# Patient Record
Sex: Female | Born: 1980 | Race: White | Hispanic: No | Marital: Married | Smoking: Never smoker
Health system: Southern US, Community
[De-identification: ages and names within clinical notes are randomized; demographics above are authoritative.]

## PROBLEM LIST (undated history)

## (undated) ENCOUNTER — Inpatient Hospital Stay (HOSPITAL_COMMUNITY): Payer: Self-pay

## (undated) DIAGNOSIS — Z789 Other specified health status: Secondary | ICD-10-CM

## (undated) DIAGNOSIS — O139 Gestational [pregnancy-induced] hypertension without significant proteinuria, unspecified trimester: Secondary | ICD-10-CM

---
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
## 2000-01-27 ENCOUNTER — Other Ambulatory Visit: Admission: RE | Admit: 2000-01-27 | Discharge: 2000-01-27 | Payer: Self-pay | Admitting: Obstetrics & Gynecology

## 2000-05-25 ENCOUNTER — Other Ambulatory Visit: Admission: RE | Admit: 2000-05-25 | Discharge: 2000-05-25 | Payer: Self-pay | Admitting: Obstetrics & Gynecology

## 2001-04-18 ENCOUNTER — Other Ambulatory Visit: Admission: RE | Admit: 2001-04-18 | Discharge: 2001-04-18 | Payer: Self-pay | Admitting: Obstetrics & Gynecology

## 2002-06-15 ENCOUNTER — Other Ambulatory Visit: Admission: RE | Admit: 2002-06-15 | Discharge: 2002-06-15 | Payer: Self-pay | Admitting: Obstetrics & Gynecology

## 2003-07-19 ENCOUNTER — Other Ambulatory Visit: Admission: RE | Admit: 2003-07-19 | Discharge: 2003-07-19 | Payer: Self-pay | Admitting: Obstetrics & Gynecology

## 2004-08-28 ENCOUNTER — Other Ambulatory Visit: Admission: RE | Admit: 2004-08-28 | Discharge: 2004-08-28 | Payer: Self-pay | Admitting: Obstetrics & Gynecology

## 2012-01-14 ENCOUNTER — Other Ambulatory Visit: Payer: Self-pay | Admitting: Obstetrics and Gynecology

## 2012-01-15 ENCOUNTER — Encounter (HOSPITAL_COMMUNITY): Payer: Self-pay | Admitting: Pharmacist

## 2012-01-18 ENCOUNTER — Encounter (HOSPITAL_COMMUNITY)
Admission: RE | Disposition: A | Discharge status: Home / Self Care | Payer: Self-pay | Source: Ambulatory Visit | Attending: Obstetrics and Gynecology

## 2012-01-18 ENCOUNTER — Encounter (HOSPITAL_COMMUNITY): Payer: Self-pay | Admitting: Anesthesiology

## 2012-01-18 ENCOUNTER — Encounter (HOSPITAL_COMMUNITY): Payer: Self-pay | Admitting: *Deleted

## 2012-01-18 ENCOUNTER — Ambulatory Visit (HOSPITAL_COMMUNITY)
Admission: RE | Admit: 2012-01-18 | Discharge: 2012-01-18 | Disposition: A | Discharge status: Home / Self Care | Payer: Medicaid Other | Source: Ambulatory Visit | Attending: Obstetrics and Gynecology | Admitting: Obstetrics and Gynecology

## 2012-01-18 ENCOUNTER — Ambulatory Visit (HOSPITAL_COMMUNITY): Payer: Medicaid Other | Admitting: Anesthesiology

## 2012-01-18 DIAGNOSIS — O021 Missed abortion: Secondary | ICD-10-CM | POA: Insufficient documentation

## 2012-01-18 HISTORY — DX: Other specified health status: Z78.9

## 2012-01-18 HISTORY — PX: DILATION AND EVACUATION: SHX1459

## 2012-01-18 LAB — CBC
MCH: 30.9 pg (ref 26.0–34.0)
MCHC: 34.4 g/dL (ref 30.0–36.0)
MCV: 90 fL (ref 78.0–100.0)
Platelets: 272 10*3/uL (ref 150–400)
RBC: 3.91 MIL/uL (ref 3.87–5.11)
RDW: 12.5 % (ref 11.5–15.5)

## 2012-01-18 SURGERY — DILATION AND EVACUATION, UTERUS
Anesthesia: Monitor Anesthesia Care | Site: Uterus | Wound class: Clean Contaminated

## 2012-01-18 MED ORDER — OXYCODONE-ACETAMINOPHEN 5-325 MG PO TABS
ORAL_TABLET | ORAL | Status: AC
  Filled 2012-01-18: qty 1

## 2012-01-18 MED ORDER — MIDAZOLAM HCL 2 MG/2ML IJ SOLN
INTRAMUSCULAR | Status: AC
  Filled 2012-01-18: qty 2

## 2012-01-18 MED ORDER — FENTANYL CITRATE 0.05 MG/ML IJ SOLN
INTRAMUSCULAR | Status: AC
  Filled 2012-01-18: qty 2

## 2012-01-18 MED ORDER — ONDANSETRON HCL 4 MG/2ML IJ SOLN: 4.0000 mg | Freq: Once | INTRAMUSCULAR | Status: DC | PRN

## 2012-01-18 MED ORDER — MIDAZOLAM HCL 5 MG/5ML IJ SOLN
INTRAMUSCULAR | Status: DC | PRN
  Administered 2012-01-18: 2 mg via INTRAVENOUS

## 2012-01-18 MED ORDER — MEPERIDINE HCL 25 MG/ML IJ SOLN: 6.2500 mg | INTRAMUSCULAR | Status: DC | PRN

## 2012-01-18 MED ORDER — DEXAMETHASONE SODIUM PHOSPHATE 10 MG/ML IJ SOLN
INTRAMUSCULAR | Status: AC
  Filled 2012-01-18: qty 1

## 2012-01-18 MED ORDER — GLYCOPYRROLATE 0.2 MG/ML IJ SOLN
INTRAMUSCULAR | Status: AC
Start: 2012-01-18 — End: 2012-01-18
  Filled 2012-01-18: qty 1

## 2012-01-18 MED ORDER — PROPOFOL 10 MG/ML IV EMUL
INTRAVENOUS | Status: DC | PRN
  Administered 2012-01-18: 40 mg via INTRAVENOUS

## 2012-01-18 MED ORDER — KETOROLAC TROMETHAMINE 30 MG/ML IJ SOLN: 15.0000 mg | Freq: Once | INTRAMUSCULAR | Status: DC | PRN

## 2012-01-18 MED ORDER — PROPOFOL 10 MG/ML IV EMUL
INTRAVENOUS | Status: AC
  Filled 2012-01-18: qty 40

## 2012-01-18 MED ORDER — KETOROLAC TROMETHAMINE 30 MG/ML IJ SOLN
INTRAMUSCULAR | Status: DC | PRN
  Administered 2012-01-18: 30 mg via INTRAVENOUS

## 2012-01-18 MED ORDER — DOXYCYCLINE HYCLATE 100 MG IV SOLR
100.0000 mg | Freq: Once | INTRAVENOUS | Status: AC
  Administered 2012-01-18: 100 mg via INTRAVENOUS
  Filled 2012-01-18: qty 100

## 2012-01-18 MED ORDER — ONDANSETRON HCL 4 MG/2ML IJ SOLN
INTRAMUSCULAR | Status: DC | PRN
  Administered 2012-01-18: 4 mg via INTRAVENOUS

## 2012-01-18 MED ORDER — ONDANSETRON HCL 4 MG/2ML IJ SOLN
INTRAMUSCULAR | Status: AC
  Filled 2012-01-18: qty 2

## 2012-01-18 MED ORDER — FENTANYL CITRATE 0.05 MG/ML IJ SOLN
INTRAMUSCULAR | Status: DC | PRN
  Administered 2012-01-18 (×4): 25 ug via INTRAVENOUS
  Administered 2012-01-18: 100 ug via INTRAVENOUS

## 2012-01-18 MED ORDER — LIDOCAINE HCL 1 % IJ SOLN
INTRAMUSCULAR | Status: DC | PRN
  Administered 2012-01-18: 10 mL

## 2012-01-18 MED ORDER — FENTANYL CITRATE 0.05 MG/ML IJ SOLN
INTRAMUSCULAR | Status: AC
  Administered 2012-01-18: 50 ug via INTRAVENOUS
  Filled 2012-01-18: qty 2

## 2012-01-18 MED ORDER — LACTATED RINGERS IV SOLN
INTRAVENOUS | Status: DC
  Administered 2012-01-18: 125 mL/h via INTRAVENOUS
  Administered 2012-01-18: 07:00:00 via INTRAVENOUS

## 2012-01-18 MED ORDER — OXYCODONE-ACETAMINOPHEN 5-325 MG PO TABS
1.0000 | ORAL_TABLET | ORAL | Status: DC | PRN
  Administered 2012-01-18: 1 via ORAL

## 2012-01-18 MED ORDER — LIDOCAINE HCL (CARDIAC) 20 MG/ML IV SOLN
INTRAVENOUS | Status: AC
  Filled 2012-01-18: qty 5

## 2012-01-18 MED ORDER — KETOROLAC TROMETHAMINE 60 MG/2ML IM SOLN
INTRAMUSCULAR | Status: DC | PRN
  Administered 2012-01-18: 30 mg via INTRAMUSCULAR

## 2012-01-18 MED ORDER — DEXAMETHASONE SODIUM PHOSPHATE 10 MG/ML IJ SOLN
INTRAMUSCULAR | Status: DC | PRN
  Administered 2012-01-18: 10 mg via INTRAVENOUS

## 2012-01-18 MED ORDER — FENTANYL CITRATE 0.05 MG/ML IJ SOLN
25.0000 ug | INTRAMUSCULAR | Status: DC | PRN
  Administered 2012-01-18 (×2): 50 ug via INTRAVENOUS

## 2012-01-18 MED ORDER — PROPOFOL 10 MG/ML IV EMUL
INTRAVENOUS | Status: DC | PRN
  Administered 2012-01-18: 120 ug/kg/min via INTRAVENOUS

## 2012-01-18 MED ORDER — LIDOCAINE HCL (CARDIAC) 20 MG/ML IV SOLN
INTRAVENOUS | Status: DC | PRN
  Administered 2012-01-18: 80 mg via INTRAVENOUS

## 2012-01-18 SURGICAL SUPPLY — 23 items
CATH ROBINSON RED A/P 16FR (CATHETERS) ×2 IMPLANT
CLOTH BEACON ORANGE TIMEOUT ST (SAFETY) ×2 IMPLANT
DECANTER SPIKE VIAL GLASS SM (MISCELLANEOUS) ×2 IMPLANT
GLOVE BIO SURGEON STRL SZ 6.5 (GLOVE) ×4 IMPLANT
GLOVE BIOGEL PI IND STRL 6.5 (GLOVE) ×2 IMPLANT
GLOVE BIOGEL PI IND STRL 7.0 (GLOVE) ×1 IMPLANT
GLOVE BIOGEL PI INDICATOR 6.5 (GLOVE) ×2
GLOVE BIOGEL PI INDICATOR 7.0 (GLOVE) ×1
GLOVE SURG SS PI 6.5 STRL IVOR (GLOVE) ×2 IMPLANT
GOWN PREVENTION PLUS LG XLONG (DISPOSABLE) ×4 IMPLANT
KIT BERKELEY 1ST TRIMESTER 3/8 (MISCELLANEOUS) ×2 IMPLANT
NEEDLE SPNL 22GX3.5 QUINCKE BK (NEEDLE) ×2 IMPLANT
NS IRRIG 1000ML POUR BTL (IV SOLUTION) ×2 IMPLANT
PACK VAGINAL MINOR WOMEN LF (CUSTOM PROCEDURE TRAY) ×2 IMPLANT
PAD OB MATERNITY 4.3X12.25 (PERSONAL CARE ITEMS) ×2 IMPLANT
PAD PREP 24X48 CUFFED NSTRL (MISCELLANEOUS) ×2 IMPLANT
SET BERKELEY SUCTION TUBING (SUCTIONS) ×2 IMPLANT
SYR CONTROL 10ML LL (SYRINGE) ×2 IMPLANT
TOWEL OR 17X24 6PK STRL BLUE (TOWEL DISPOSABLE) ×4 IMPLANT
VACURETTE 10 RIGID CVD (CANNULA) ×2 IMPLANT
VACURETTE 7MM CVD STRL WRAP (CANNULA) IMPLANT
VACURETTE 8 RIGID CVD (CANNULA) IMPLANT
VACURETTE 9 RIGID CVD (CANNULA) IMPLANT

## 2012-01-18 NOTE — Transfer of Care (Signed)
Immediate Anesthesia Transfer of Care Note  Patient: Robin Gutierrez  Procedure(s) Performed: Procedure(s) (LRB): DILATATION AND EVACUATION (N/A)  Patient Location: PACU  Anesthesia Type: MAC  Level of Consciousness: awake, alert  and oriented  Airway & Oxygen Therapy: Patient Spontanous Breathing and Patient connected to nasal cannula oxygen  Post-op Assessment: Report given to PACU RN and Post -op Vital signs reviewed and stable  Post vital signs: Reviewed and stable  Complications: No apparent anesthesia complications

## 2012-01-18 NOTE — Anesthesia Postprocedure Evaluation (Signed)
Anesthesia Post Note  Patient: Robin Gutierrez  Procedure(s) Performed: Procedure(s) (LRB): DILATATION AND EVACUATION (N/A)  Anesthesia type: MAC  Patient location: PACU  Post pain: Pain level controlled  Post assessment: Post-op Vital signs reviewed  Last Vitals:  Filed Vitals:   01/18/12 0945  BP: 105/55  Pulse: 77  Temp: 36.9 C  Resp: 16    Post vital signs: Reviewed  Level of consciousness: sedated  Complications: No apparent anesthesia complications

## 2012-01-18 NOTE — Anesthesia Preprocedure Evaluation (Signed)
Anesthesia Evaluation  Patient identified by MRN, date of birth, ID band Patient awake    Reviewed: Allergy & Precautions, H&P , NPO status , Patient's Chart, lab work & pertinent test results  Airway Mallampati: I TM Distance: >3 FB Neck ROM: full    Dental No notable dental hx. (+) Teeth Intact   Pulmonary neg pulmonary ROS,    Pulmonary exam normal       Cardiovascular negative cardio ROS      Neuro/Psych negative neurological ROS  negative psych ROS   GI/Hepatic negative GI ROS, Neg liver ROS,   Endo/Other  negative endocrine ROS  Renal/GU negative Renal ROS  negative genitourinary   Musculoskeletal negative musculoskeletal ROS (+)   Abdominal Normal abdominal exam  (+)   Peds negative pediatric ROS (+)  Hematology negative hematology ROS (+)   Anesthesia Other Findings   Reproductive/Obstetrics negative OB ROS                           Anesthesia Physical Anesthesia Plan  ASA: II  Anesthesia Plan: MAC   Post-op Pain Management:    Induction: Intravenous  Airway Management Planned:   Additional Equipment:   Intra-op Plan:   Post-operative Plan:   Informed Consent: I have reviewed the patients History and Physical, chart, labs and discussed the procedure including the risks, benefits and alternatives for the proposed anesthesia with the patient or authorized representative who has indicated his/her understanding and acceptance.     Plan Discussed with: CRNA and Surgeon  Anesthesia Plan Comments:         Anesthesia Quick Evaluation  

## 2012-01-18 NOTE — H&P (Signed)
31 y.o. presented to office om Friday for routine Ob visit and was found to have fetal demise at appros 10 weeks 5 days.  Treatment options were reviewed with patient and risks and benefits discussed.  Pt elected to have D&E which was scheduled for today.  Pt denied bleeding and any other complaints.  Past Medical History  Diagnosis Date  . No pertinent past medical history    Past Surgical History  Procedure Date  . No past surgeries     History   Social History  . Marital Status: Single    Spouse Name: N/A    Number of Children: N/A  . Years of Education: N/A   Occupational History  . Not on file.   Social History Main Topics  . Smoking status: Not on file  . Smokeless tobacco: Not on file  . Alcohol Use:   . Drug Use: No  . Sexually Active: Yes    Birth Control/ Protection: None   Other Topics Concern  . Not on file   Social History Narrative  . No narrative on file    No current facility-administered medications on file prior to encounter.   No current outpatient prescriptions on file prior to encounter.    No Known Allergies  @VITALS2 @  Lungs: clear to ascultation Cor:  RRR Abdomen:  soft, nontender, nondistended. Ex:  no cords, erythema Pelvic:  def  A:  MAB @10 .5   P:  D&E.   All risks, benefits and alternatives d/w patient and she desires to proceed with a surgery. 100mg  IV doxycycline ordered to be given preop.  /rx for doxycycline 100mg  po will be given to patient to take 12 hrs post-procedure. Philip Aspen

## 2012-01-19 ENCOUNTER — Encounter (HOSPITAL_COMMUNITY): Payer: Self-pay | Admitting: Obstetrics and Gynecology

## 2012-01-21 NOTE — Op Note (Signed)
Robin Gutierrez, Robin Gutierrez             ACCOUNT NO.:  [id number]  MEDICAL RECORD NO.:  [id number]  LOCATION:  [facility]                          FACILITY:  [facility]  PHYSICIAN:  Philip Aspen, DO    DATE OF BIRTH:  21-Nov-1980  DATE OF PROCEDURE:  01/18/2012 DATE OF DISCHARGE:  01/18/2012                              OPERATIVE REPORT   PREOPERATIVE DIAGNOSIS:  10-week 5-day missed abortion.  POSTOPERATIVE DIAGNOSIS:  10-week 5-day missed abortion.  PROCEDURE:  D and E.  SURGEON:  Philip Aspen, DO.  ANESTHESIA:  IV sedation.  FLUIDS:  Please see Anesthesia report for further details.  ESTIMATED BLOOD LOSS:  100 mL.  URINE OUTPUT:  Approximately 10 mL clear cath preop.  FINDINGS:  Uterus approximately 10-week size on bimanual examination with a closed cervix.  SPECIMENS:  Products of conception.  COMPLICATIONS:  None.  CONDITION:  Stable to PACU.  DESCRIPTION OF PROCEDURE:  The patient was taken to the operating room, where IV sedation was administered and found to be adequate.  After the risks, benefits, and details of procedure and consent was obtained, the patient was prepped and draped in normal sterile fashion in dorsal lithotomy position.  A weighted speculum plug was placed at the posterior aspect of the vagina.  Sims retractor was used to facilitate the anterior lip of the cervix, which was grasped with single-tooth tenaculum.  The patient's cervix was serially dilated to approximately 31 Pratt.  A 10 curved curette was passed through the cervical canal gently to the fundus of the uterus and suction was applied.  Three passes of the suction curette were performed, followed by gentle curettage of all 4 quadrants. Minimal bleeding was noted and good uterine cry was appreciated.  All instruments were removed from the vagina.  Tenaculum sites were found to be hemostatic.  The patient tolerated the procedure well.  Sponge, lap, and needle counts were correct x2.  The patient  was taken to recovery in stable condition.          ______________________________ Philip Aspen, DO     [place]/MEDQ  D:  01/21/2012  T:  01/21/2012  Job:  [id number]

## 2013-05-19 LAB — OB RESULTS CONSOLE HEPATITIS B SURFACE ANTIGEN: HEP B S AG: NEGATIVE

## 2013-05-19 LAB — OB RESULTS CONSOLE HIV ANTIBODY (ROUTINE TESTING): HIV: NONREACTIVE

## 2013-05-19 LAB — OB RESULTS CONSOLE RUBELLA ANTIBODY, IGM: Rubella: IMMUNE

## 2013-10-11 ENCOUNTER — Inpatient Hospital Stay (HOSPITAL_COMMUNITY): Payer: Medicaid Other

## 2013-10-11 ENCOUNTER — Inpatient Hospital Stay (HOSPITAL_COMMUNITY)
Admission: AD | Admit: 2013-10-11 | Discharge: 2013-10-11 | Disposition: A | Discharge status: Home / Self Care | Payer: Medicaid Other | Source: Ambulatory Visit | Attending: Obstetrics and Gynecology | Admitting: Obstetrics and Gynecology

## 2013-10-11 ENCOUNTER — Encounter (HOSPITAL_COMMUNITY): Payer: Self-pay | Admitting: *Deleted

## 2013-10-11 DIAGNOSIS — O99891 Other specified diseases and conditions complicating pregnancy: Secondary | ICD-10-CM | POA: Insufficient documentation

## 2013-10-11 DIAGNOSIS — R109 Unspecified abdominal pain: Secondary | ICD-10-CM | POA: Insufficient documentation

## 2013-10-11 DIAGNOSIS — O9989 Other specified diseases and conditions complicating pregnancy, childbirth and the puerperium: Principal | ICD-10-CM

## 2013-10-11 DIAGNOSIS — W07XXXA Fall from chair, initial encounter: Secondary | ICD-10-CM | POA: Insufficient documentation

## 2013-10-11 DIAGNOSIS — Y929 Unspecified place or not applicable: Secondary | ICD-10-CM | POA: Insufficient documentation

## 2013-10-11 MED ORDER — LACTATED RINGERS IV BOLUS (SEPSIS): 500.0000 mL | Freq: Once | INTRAVENOUS | Status: DC

## 2013-10-11 MED ORDER — NIFEDIPINE 10 MG PO CAPS
10.0000 mg | ORAL_CAPSULE | Freq: Once | ORAL | Status: AC
  Administered 2013-10-11: 10 mg via ORAL
  Filled 2013-10-11: qty 1

## 2013-10-11 NOTE — Discharge Instructions (Signed)
Keep your scheduled appointment for prenatal care. Drink 8-10 glasses of water per day. No intercourse or sexual activity. Rest frequently on your side. No exercise or strenuous housework.

## 2013-10-11 NOTE — MAU Note (Signed)
States she was sitting in a lounge chair and it collapsed. She fell onto to belly onto concrete. States she feels some cramping.

## 2013-10-11 NOTE — Progress Notes (Signed)
Dr. Dareen PianoAnderson given report of patient arrival to MAU with C/O fall onto abdomen. Order received for U/S. MD states if U/S normal, patient may be discharged.

## 2013-10-11 NOTE — Progress Notes (Signed)
When patient informed of IV fluids, she asked if she could just drink more fluids. States UC's less frequent and less uncomfortable. Minimal UC's on tracing. Will call MD with update.

## 2013-11-02 ENCOUNTER — Ambulatory Visit (INDEPENDENT_AMBULATORY_CARE_PROVIDER_SITE_OTHER): Payer: Self-pay | Admitting: Pediatrics

## 2013-11-02 DIAGNOSIS — Z7681 Expectant parent(s) prebirth pediatrician visit: Secondary | ICD-10-CM

## 2013-11-02 NOTE — Progress Notes (Signed)
Prenatal counseling for impending newborn done  

## 2013-11-04 ENCOUNTER — Inpatient Hospital Stay (HOSPITAL_COMMUNITY)
Admission: AD | Admit: 2013-11-04 | Discharge: 2013-11-05 | Disposition: A | Discharge status: Home / Self Care | Payer: Medicaid Other | Source: Ambulatory Visit | Attending: Obstetrics and Gynecology | Admitting: Obstetrics and Gynecology

## 2013-11-04 ENCOUNTER — Encounter (HOSPITAL_COMMUNITY): Payer: Self-pay | Admitting: *Deleted

## 2013-11-04 DIAGNOSIS — O479 False labor, unspecified: Secondary | ICD-10-CM | POA: Insufficient documentation

## 2013-11-04 DIAGNOSIS — O321XX0 Maternal care for breech presentation, not applicable or unspecified: Secondary | ICD-10-CM | POA: Insufficient documentation

## 2013-11-04 NOTE — MAU Note (Signed)
Contractions since this afternoon and have gotten stronger. Baby is breech and for c/s 5/15

## 2013-11-13 ENCOUNTER — Inpatient Hospital Stay (HOSPITAL_COMMUNITY)
Admission: AD | Admit: 2013-11-13 | Discharge: 2013-11-16 | DRG: 765 | Disposition: A | Discharge status: Home / Self Care | Payer: Medicaid Other | Source: Ambulatory Visit | Attending: Obstetrics and Gynecology | Admitting: Obstetrics and Gynecology

## 2013-11-13 ENCOUNTER — Encounter (HOSPITAL_COMMUNITY): Payer: Self-pay | Admitting: *Deleted

## 2013-11-13 ENCOUNTER — Inpatient Hospital Stay (HOSPITAL_COMMUNITY): Payer: Medicaid Other | Admitting: Anesthesiology

## 2013-11-13 ENCOUNTER — Encounter (HOSPITAL_COMMUNITY)
Admission: AD | Disposition: A | Discharge status: Home / Self Care | Payer: Self-pay | Source: Ambulatory Visit | Attending: Obstetrics and Gynecology

## 2013-11-13 ENCOUNTER — Encounter (HOSPITAL_COMMUNITY): Payer: Medicaid Other | Admitting: Anesthesiology

## 2013-11-13 DIAGNOSIS — O26899 Other specified pregnancy related conditions, unspecified trimester: Secondary | ICD-10-CM | POA: Diagnosis not present

## 2013-11-13 DIAGNOSIS — D649 Anemia, unspecified: Secondary | ICD-10-CM | POA: Diagnosis present

## 2013-11-13 DIAGNOSIS — O321XX0 Maternal care for breech presentation, not applicable or unspecified: Principal | ICD-10-CM | POA: Diagnosis present

## 2013-11-13 DIAGNOSIS — L299 Pruritus, unspecified: Secondary | ICD-10-CM | POA: Diagnosis not present

## 2013-11-13 DIAGNOSIS — O139 Gestational [pregnancy-induced] hypertension without significant proteinuria, unspecified trimester: Secondary | ICD-10-CM | POA: Diagnosis present

## 2013-11-13 DIAGNOSIS — O9902 Anemia complicating childbirth: Secondary | ICD-10-CM | POA: Diagnosis present

## 2013-11-13 HISTORY — DX: Gestational (pregnancy-induced) hypertension without significant proteinuria, unspecified trimester: O13.9

## 2013-11-13 LAB — CBC
HEMATOCRIT: 34.1 % — AB (ref 36.0–46.0)
Hemoglobin: 11.7 g/dL — ABNORMAL LOW (ref 12.0–15.0)
MCH: 30.2 pg (ref 26.0–34.0)
MCHC: 34.3 g/dL (ref 30.0–36.0)
MCV: 87.9 fL (ref 78.0–100.0)
Platelets: 215 10*3/uL (ref 150–400)
RBC: 3.88 MIL/uL (ref 3.87–5.11)
RDW: 13.4 % (ref 11.5–15.5)
WBC: 12.4 10*3/uL — AB (ref 4.0–10.5)

## 2013-11-13 LAB — COMPREHENSIVE METABOLIC PANEL
ALK PHOS: 144 U/L — AB (ref 39–117)
ALT: 8 U/L (ref 0–35)
AST: 20 U/L (ref 0–37)
Albumin: 2.9 g/dL — ABNORMAL LOW (ref 3.5–5.2)
BUN: 5 mg/dL — AB (ref 6–23)
CHLORIDE: 98 meq/L (ref 96–112)
CO2: 19 mEq/L (ref 19–32)
Calcium: 9.4 mg/dL (ref 8.4–10.5)
Creatinine, Ser: 0.57 mg/dL (ref 0.50–1.10)
GLUCOSE: 133 mg/dL — AB (ref 70–99)
POTASSIUM: 3.7 meq/L (ref 3.7–5.3)
Sodium: 137 mEq/L (ref 137–147)
Total Protein: 7.1 g/dL (ref 6.0–8.3)

## 2013-11-13 LAB — PROTEIN / CREATININE RATIO, URINE
CREATININE, URINE: 58.8 mg/dL
Protein Creatinine Ratio: 0.22 — ABNORMAL HIGH (ref 0.00–0.15)
TOTAL PROTEIN, URINE: 12.7 mg/dL

## 2013-11-13 LAB — LACTATE DEHYDROGENASE: LDH: 223 U/L (ref 94–250)

## 2013-11-13 LAB — TYPE AND SCREEN
ABO/RH(D): B POS
Antibody Screen: NEGATIVE

## 2013-11-13 LAB — URIC ACID: Uric Acid, Serum: 5.5 mg/dL (ref 2.4–7.0)

## 2013-11-13 SURGERY — Surgical Case
Anesthesia: Spinal | Site: Abdomen

## 2013-11-13 MED ORDER — PHENYLEPHRINE HCL 10 MG/ML IJ SOLN
INTRAMUSCULAR | Status: DC | PRN
  Administered 2013-11-13: 80 ug via INTRAVENOUS

## 2013-11-13 MED ORDER — LACTATED RINGERS IV SOLN
INTRAVENOUS | Status: DC
  Administered 2013-11-13: 23:00:00 via INTRAVENOUS

## 2013-11-13 MED ORDER — OXYTOCIN 10 UNIT/ML IJ SOLN
INTRAMUSCULAR | Status: DC | PRN
  Administered 2013-11-13: 40 [IU] via INTRAMUSCULAR

## 2013-11-13 MED ORDER — FAMOTIDINE IN NACL 20-0.9 MG/50ML-% IV SOLN
20.0000 mg | Freq: Once | INTRAVENOUS | Status: DC
  Filled 2013-11-13: qty 50

## 2013-11-13 MED ORDER — BUPIVACAINE IN DEXTROSE 0.75-8.25 % IT SOLN
INTRATHECAL | Status: DC | PRN
  Administered 2013-11-13: 1.8 mL via INTRATHECAL

## 2013-11-13 MED ORDER — LACTATED RINGERS IV BOLUS (SEPSIS): 1000.0000 mL | Freq: Once | INTRAVENOUS | Status: DC

## 2013-11-13 MED ORDER — LACTATED RINGERS IV SOLN
INTRAVENOUS | Status: DC
  Administered 2013-11-13: via INTRAVENOUS

## 2013-11-13 MED ORDER — CITRIC ACID-SODIUM CITRATE 334-500 MG/5ML PO SOLN
30.0000 mL | Freq: Once | ORAL | Status: AC
  Administered 2013-11-13: 30 mL via ORAL
  Filled 2013-11-13: qty 15

## 2013-11-13 MED ORDER — MEPERIDINE HCL 25 MG/ML IJ SOLN
INTRAMUSCULAR | Status: DC | PRN
  Administered 2013-11-13 (×2): 12.5 mg via INTRAVENOUS

## 2013-11-13 MED ORDER — FENTANYL CITRATE 0.05 MG/ML IJ SOLN
INTRAMUSCULAR | Status: DC | PRN
  Administered 2013-11-13: 25 ug via INTRATHECAL

## 2013-11-13 MED ORDER — ONDANSETRON HCL 4 MG/2ML IJ SOLN
INTRAMUSCULAR | Status: DC | PRN
  Administered 2013-11-13: 4 mg via INTRAVENOUS

## 2013-11-13 MED ORDER — MORPHINE SULFATE (PF) 0.5 MG/ML IJ SOLN
INTRAMUSCULAR | Status: DC | PRN
  Administered 2013-11-13: .2 mg via INTRATHECAL

## 2013-11-13 MED ORDER — CEFAZOLIN SODIUM-DEXTROSE 2-3 GM-% IV SOLR
2.0000 g | INTRAVENOUS | Status: AC
  Administered 2013-11-13: 2 g via INTRAVENOUS
  Filled 2013-11-13: qty 50

## 2013-11-13 MED ORDER — DEXTROSE IN LACTATED RINGERS 5 % IV SOLN
INTRAVENOUS | Status: DC
  Administered 2013-11-13: 19:00:00 via INTRAVENOUS

## 2013-11-13 MED ORDER — ACETAMINOPHEN 10 MG/ML IV SOLN
1000.0000 mg | Freq: Once | INTRAVENOUS | Status: AC
  Administered 2013-11-13: 1000 mg via INTRAVENOUS
  Filled 2013-11-13: qty 100

## 2013-11-13 SURGICAL SUPPLY — 31 items
CLAMP CORD UMBIL (MISCELLANEOUS) IMPLANT
CLOTH BEACON ORANGE TIMEOUT ST (SAFETY) ×3 IMPLANT
DRAPE LG THREE QUARTER DISP (DRAPES) IMPLANT
DRSG OPSITE POSTOP 4X10 (GAUZE/BANDAGES/DRESSINGS) ×3 IMPLANT
DURAPREP 26ML APPLICATOR (WOUND CARE) ×3 IMPLANT
ELECT REM PT RETURN 9FT ADLT (ELECTROSURGICAL) ×3
ELECTRODE REM PT RTRN 9FT ADLT (ELECTROSURGICAL) ×1 IMPLANT
EXTRACTOR VACUUM M CUP 4 TUBE (SUCTIONS) IMPLANT
EXTRACTOR VACUUM M CUP 4' TUBE (SUCTIONS)
GLOVE BIO SURGEON STRL SZ 6.5 (GLOVE) ×2 IMPLANT
GLOVE BIO SURGEONS STRL SZ 6.5 (GLOVE) ×1
GLOVE BIOGEL PI IND STRL 6.5 (GLOVE) ×1 IMPLANT
GLOVE BIOGEL PI INDICATOR 6.5 (GLOVE) ×2
GOWN STRL REUS W/TWL LRG LVL3 (GOWN DISPOSABLE) ×6 IMPLANT
KIT ABG SYR 3ML LUER SLIP (SYRINGE) IMPLANT
NEEDLE HYPO 25X5/8 SAFETYGLIDE (NEEDLE) IMPLANT
NS IRRIG 1000ML POUR BTL (IV SOLUTION) ×3 IMPLANT
PACK C SECTION WH (CUSTOM PROCEDURE TRAY) ×3 IMPLANT
PAD OB MATERNITY 4.3X12.25 (PERSONAL CARE ITEMS) ×3 IMPLANT
RTRCTR C-SECT PINK 25CM LRG (MISCELLANEOUS) ×3 IMPLANT
STAPLER VISISTAT 35W (STAPLE) IMPLANT
SUT MON AB 2-0 CT1 27 (SUTURE) ×3 IMPLANT
SUT MON AB 4-0 PS1 27 (SUTURE) IMPLANT
SUT PDS AB 0 CTX 60 (SUTURE) IMPLANT
SUT PLAIN 2 0 XLH (SUTURE) ×3 IMPLANT
SUT VIC AB 0 CTX 36 (SUTURE) ×8
SUT VIC AB 0 CTX36XBRD ANBCTRL (SUTURE) ×4 IMPLANT
SUT VIC AB 4-0 KS 27 (SUTURE) ×3 IMPLANT
TOWEL OR 17X24 6PK STRL BLUE (TOWEL DISPOSABLE) ×3 IMPLANT
TRAY FOLEY CATH 14FR (SET/KITS/TRAYS/PACK) ×3 IMPLANT
WATER STERILE IRR 1000ML POUR (IV SOLUTION) ×3 IMPLANT

## 2013-11-13 NOTE — MAU Note (Signed)
Has a planned cesarean section on 5-15 due to breech presentation.

## 2013-11-13 NOTE — H&P (Signed)
33 y.o. [redacted]w[redacted]d G2P0010 comes in from office with an elevated BP of 120/90 and headache.  Otherwise has good fetal movement and no bleeding.    Past Medical History  Diagnosis Date  . No pertinent past medical history   . Pregnancy induced hypertension     Past Surgical History  Procedure Laterality Date  . Dilation and evacuation  01/18/2012    Procedure: DILATATION AND EVACUATION;  Surgeon: SAllyn Kenner DO;  [facility];  Service: Gynecology;  Laterality: N/A;    OB History  Gravida Para Term Preterm AB SAB TAB Ectopic Multiple Living  2    1 1     0    # Outcome Date GA Lbr Len/2nd Weight Sex Delivery Anes PTL Lv  2 CUR           1 SAB               History   Social History  . Marital Status: Married    Spouse Name: N/A    Number of Children: N/A  . Years of Education: N/A   Occupational History  . Not on file.   Social History Main Topics  . Smoking status: Never Smoker   . Smokeless tobacco: Not on file  . Alcohol Use: No  . Drug Use: No  . Sexual Activity: Yes    Birth Control/ Protection: None   Other Topics Concern  . Not on file   Social History Narrative  . No narrative on file   Review of patient's allergies indicates no known allergies.    Prenatal Transfer Tool  Maternal Diabetes: No Genetic Screening: Normal Maternal Ultrasounds/Referrals: Abnormal:  Findings:   Other: megacystic 5.7cm, repeat 1 week later (today 6.1cm), breech Fetal Ultrasounds or other Referrals:  None Maternal Substance Abuse:  No Significant Maternal Medications:  None Significant Maternal Lab Results: Lab values include: Group B Strep negative  Other PNC: new onset elevated BPs, met criteria for GHTN tonight in MAU, normal labs    Filed Vitals:   11/13/13 2146  BP: 139/85  Pulse: 100  Temp:   Resp:      Lungs/Cor:  NAD Abdomen:  soft, gravid Ex:  no cords, erythema SVE:  def FHTs:  130, good STV, NST R Toco:  irreg   A/P   Admit for prime c/s for  breech with GHTN  GBS Neg  2g ancef  Other routine pre-op care  SAllyn Kenner

## 2013-11-13 NOTE — Anesthesia Preprocedure Evaluation (Signed)
Anesthesia Evaluation  Patient identified by MRN, date of birth, ID band Patient awake    Reviewed: Allergy & Precautions, H&P , NPO status , Patient's Chart, lab work & pertinent test results  History of Anesthesia Complications Negative for: history of anesthetic complications  Airway Mallampati: II TM Distance: >3 FB Neck ROM: Full    Dental  (+) Teeth Intact   Pulmonary neg pulmonary ROS,  breath sounds clear to auscultation        Cardiovascular hypertension, Rhythm:Regular  htn of pregnancy    Neuro/Psych negative neurological ROS  negative psych ROS   GI/Hepatic negative GI ROS, Neg liver ROS,   Endo/Other  negative endocrine ROS  Renal/GU negative Renal ROS     Musculoskeletal   Abdominal   Peds  Hematology  (+) anemia ,   Anesthesia Other Findings   Reproductive/Obstetrics (+) Pregnancy                           Anesthesia Physical Anesthesia Plan  ASA: II  Anesthesia Plan: Spinal   Post-op Pain Management:    Induction:   Airway Management Planned: Natural Airway  Additional Equipment: None  Intra-op Plan:   Post-operative Plan:   Informed Consent: I have reviewed the patients History and Physical, chart, labs and discussed the procedure including the risks, benefits and alternatives for the proposed anesthesia with the patient or authorized representative who has indicated his/her understanding and acceptance.   Dental advisory given  Plan Discussed with: CRNA and Surgeon  Anesthesia Plan Comments:         Anesthesia Quick Evaluation

## 2013-11-13 NOTE — Anesthesia Procedure Notes (Signed)
Spinal  Patient location during procedure: OB Start time: 11/13/2013 11:05 PM End time: 11/13/2013 11:09 PM Staffing Anesthesiologist: Guneet Delpino, CHRIS Preanesthetic Checklist Completed: patient identified, surgical consent, pre-op evaluation, timeout performed, IV checked, risks and benefits discussed and monitors and equipment checked Spinal Block Patient position: sitting Prep: site prepped and draped and DuraPrep Patient monitoring: heart rate, cardiac monitor, continuous pulse ox and blood pressure Approach: midline Location: L3-4 Injection technique: single-shot Needle Needle type: Pencan  Needle gauge: 24 G Needle length: 10 cm Assessment Sensory level: T4

## 2013-11-13 NOTE — MAU Provider Note (Signed)
History     CSN: [id number]  Arrival date and time: 11/13/13 1559   None     Chief Complaint  Patient presents with  . Pre-Eclampsia  . Leg Swelling  . Headache   HPI This is a 33 y.o. female at 4775w3d who presents for evaluation for preeclampsia. Has been having increased edema and headache. BP elevated in office. Denies abdominal pain.   RN Note: Patient states she was seen in the office today and her blood pressure was elevated. Has had a headache and increasing swelling today. Patient denies contractions, bleeding or leaking. Has a heavy discharge. Reports good fetal movement.        OB History   Grav Para Term Preterm Abortions TAB SAB Ect Mult Living   2    1  1    0      Past Medical History  Diagnosis Date  . No pertinent past medical history   . Medical history non-contributory     Past Surgical History  Procedure Laterality Date  . No past surgeries    . Dilation and evacuation  01/18/2012    Procedure: DILATATION AND EVACUATION;  Surgeon: Philip AspenSidney Callahan, DO;  Location: [facility];  Service: Gynecology;  Laterality: N/A;    No family history on file.  History  Substance Use Topics  . Smoking status: Never Smoker   . Smokeless tobacco: Not on file  . Alcohol Use: Not on file    Allergies: No Known Allergies  Prescriptions prior to admission  Medication Sig Dispense Refill  . calcium carbonate (TUMS - DOSED IN MG ELEMENTAL CALCIUM) 500 MG chewable tablet Chew 1 tablet by mouth daily.      . Prenatal Vit-Fe Fumarate-FA (PRENATAL MULTIVITAMIN) TABS Take 1 tablet by mouth daily.        Review of Systems  Constitutional: Negative for fever, chills and malaise/fatigue.  Eyes: Negative for blurred vision and double vision.  Respiratory: Negative for shortness of breath.   Cardiovascular: Negative for chest pain.  Gastrointestinal: Negative for nausea, vomiting, abdominal pain, diarrhea and constipation.  Genitourinary: Negative for dysuria.   Neurological: Positive for headaches. Negative for dizziness and focal weakness.   Physical Exam   Blood pressure 140/100, pulse 120, temperature 98.4 F (36.9 C), temperature source Oral, resp. rate 16, height 5\' 4"  (1.626 m), weight 87.998 kg (194 lb), SpO2 100.00%.  Filed Vitals:   11/13/13 1716 11/13/13 1731 11/13/13 1746 11/13/13 1801  BP: 131/83 144/87 137/85 130/85  Pulse: 107 100 110 110  Temp:      TempSrc:      Resp:      Height:      Weight:      SpO2:        Physical Exam  Constitutional: She is oriented to person, place, and time. She appears well-developed and well-nourished. No distress.  HENT:  Head: Normocephalic.  Cardiovascular: Normal rate, regular rhythm and normal heart sounds.  Exam reveals no gallop and no friction rub.   No murmur heard. Respiratory: Effort normal. No respiratory distress. She has no wheezes. She has no rales.  GI: Soft. There is no tenderness. There is no rebound and no guarding.  Musculoskeletal: Normal range of motion.  Neurological: She is alert and oriented to person, place, and time. She displays abnormal reflex (3+). She exhibits abnormal muscle tone (1 beat of clonus).  Skin: Skin is warm and dry.  Psychiatric: She has a normal mood and affect.   FHR reactive. Irregular  contractions  MAU Course  Procedures  MDM Results for orders placed during the hospital encounter of 11/13/13 (from the past 24 hour(s))  PROTEIN / CREATININE RATIO, URINE     Status: Abnormal   Collection Time    11/13/13  4:30 PM      Result Value Ref Range   Creatinine, Urine 58.80     Total Protein, Urine 12.7     PROTEIN CREATININE RATIO 0.22 (*) 0.00 - 0.15  URIC ACID     Status: None   Collection Time    11/13/13  4:32 PM      Result Value Ref Range   Uric Acid, Serum 5.5  2.4 - 7.0 mg/dL  LACTATE DEHYDROGENASE     Status: None   Collection Time    11/13/13  4:32 PM      Result Value Ref Range   LDH 223  94 - 250 U/L  CBC     Status:  Abnormal   Collection Time    11/13/13  4:32 PM      Result Value Ref Range   WBC 12.4 (*) 4.0 - 10.5 K/uL   RBC 3.88  3.87 - 5.11 MIL/uL   Hemoglobin 11.7 (*) 12.0 - 15.0 g/dL   HCT 16.134.1 (*) 09.636.0 - 04.546.0 %   MCV 87.9  78.0 - 100.0 fL   MCH 30.2  26.0 - 34.0 pg   MCHC 34.3  30.0 - 36.0 g/dL   RDW 40.913.4  81.111.5 - 91.415.5 %   Platelets 215  150 - 400 K/uL  COMPREHENSIVE METABOLIC PANEL     Status: Abnormal   Collection Time    11/13/13  4:32 PM      Result Value Ref Range   Sodium 137  137 - 147 mEq/L   Potassium 3.7  3.7 - 5.3 mEq/L   Chloride 98  96 - 112 mEq/L   CO2 19  19 - 32 mEq/L   Glucose, Bld 133 (*) 70 - 99 mg/dL   BUN 5 (*) 6 - 23 mg/dL   Creatinine, Ser 7.820.57  0.50 - 1.10 mg/dL   Calcium 9.4  8.4 - 95.610.5 mg/dL   Total Protein 7.1  6.0 - 8.3 g/dL   Albumin 2.9 (*) 3.5 - 5.2 g/dL   AST 20  0 - 37 U/L   ALT 8  0 - 35 U/L   Alkaline Phosphatase 144 (*) 39 - 117 U/L   Total Bilirubin <0.2 (*) 0.3 - 1.2 mg/dL   GFR calc non Af Amer >90  >90 mL/min   GFR calc Af Amer >90  >90 mL/min     Assessment and Plan  A:  SIUP at 6627w3d        Pregnancy induced hypertension, nonsevere, labile       No clear evidence of preeclampsia  P;  Discussed with Dr Claiborne Billingsallahan       Monitor for 2 more hours.  If severe Htn twice 4 hrs apart, will do C/S       NPO       Pt requests IVF since NPO       Tylenol IV for headache (per Dr Claiborne Billingsallahan)  Aviva SignsMarie L Batu Cassin 11/13/2013, 4:40 PM

## 2013-11-13 NOTE — MAU Note (Signed)
Patient states she was seen in the office today and her blood pressure was elevated. Has had a headache and increasing swelling today. Patient denies contractions, bleeding or leaking. Has a heavy discharge. Reports good fetal movement.

## 2013-11-14 ENCOUNTER — Encounter (HOSPITAL_COMMUNITY): Payer: Self-pay | Admitting: *Deleted

## 2013-11-14 ENCOUNTER — Encounter (HOSPITAL_COMMUNITY): Payer: Self-pay | Admitting: Anesthesiology

## 2013-11-14 LAB — CBC
HCT: 28.8 % — ABNORMAL LOW (ref 36.0–46.0)
HEMOGLOBIN: 9.8 g/dL — AB (ref 12.0–15.0)
MCH: 30.2 pg (ref 26.0–34.0)
MCHC: 34 g/dL (ref 30.0–36.0)
MCV: 88.6 fL (ref 78.0–100.0)
Platelets: 170 10*3/uL (ref 150–400)
RBC: 3.25 MIL/uL — ABNORMAL LOW (ref 3.87–5.11)
RDW: 13.5 % (ref 11.5–15.5)
WBC: 15.5 10*3/uL — AB (ref 4.0–10.5)

## 2013-11-14 LAB — RPR

## 2013-11-14 MED ORDER — SODIUM CHLORIDE 0.9 % IJ SOLN: 3.0000 mL | INTRAMUSCULAR | Status: DC | PRN

## 2013-11-14 MED ORDER — KETOROLAC TROMETHAMINE 30 MG/ML IJ SOLN
30.0000 mg | Freq: Four times a day (QID) | INTRAMUSCULAR | Status: AC | PRN
  Administered 2013-11-14: 30 mg via INTRAVENOUS

## 2013-11-14 MED ORDER — DIPHENHYDRAMINE HCL 50 MG/ML IJ SOLN: 12.5000 mg | INTRAMUSCULAR | Status: DC | PRN

## 2013-11-14 MED ORDER — NALBUPHINE HCL 10 MG/ML IJ SOLN
5.0000 mg | INTRAMUSCULAR | Status: DC | PRN
  Administered 2013-11-15 (×2): 5 mg via SUBCUTANEOUS
  Filled 2013-11-14 (×2): qty 1

## 2013-11-14 MED ORDER — LACTATED RINGERS IV SOLN
INTRAVENOUS | Status: DC
  Administered 2013-11-14: 04:00:00 via INTRAVENOUS

## 2013-11-14 MED ORDER — IBUPROFEN 600 MG PO TABS
600.0000 mg | ORAL_TABLET | Freq: Four times a day (QID) | ORAL | Status: DC
  Administered 2013-11-14 – 2013-11-16 (×9): 600 mg via ORAL
  Filled 2013-11-14 (×9): qty 1

## 2013-11-14 MED ORDER — ONDANSETRON HCL 4 MG/2ML IJ SOLN: 4.0000 mg | Freq: Three times a day (TID) | INTRAMUSCULAR | Status: DC | PRN

## 2013-11-14 MED ORDER — DIBUCAINE 1 % RE OINT: 1.0000 "application " | TOPICAL_OINTMENT | RECTAL | Status: DC | PRN

## 2013-11-14 MED ORDER — ONDANSETRON HCL 4 MG/2ML IJ SOLN: 4.0000 mg | INTRAMUSCULAR | Status: DC | PRN

## 2013-11-14 MED ORDER — SIMETHICONE 80 MG PO CHEW
80.0000 mg | CHEWABLE_TABLET | Freq: Three times a day (TID) | ORAL | Status: DC
  Administered 2013-11-14 – 2013-11-16 (×8): 80 mg via ORAL
  Filled 2013-11-14 (×7): qty 1

## 2013-11-14 MED ORDER — OXYTOCIN 40 UNITS IN LACTATED RINGERS INFUSION - SIMPLE MED: 62.5000 mL/h | INTRAVENOUS | Status: AC

## 2013-11-14 MED ORDER — FENTANYL CITRATE 0.05 MG/ML IJ SOLN
INTRAMUSCULAR | Status: AC
  Filled 2013-11-14: qty 2

## 2013-11-14 MED ORDER — OXYCODONE-ACETAMINOPHEN 5-325 MG PO TABS
1.0000 | ORAL_TABLET | ORAL | Status: DC | PRN
  Administered 2013-11-14 (×2): 2 via ORAL
  Administered 2013-11-14 – 2013-11-15 (×2): 1 via ORAL
  Administered 2013-11-15 – 2013-11-16 (×7): 2 via ORAL
  Filled 2013-11-14 (×3): qty 2
  Filled 2013-11-14: qty 1
  Filled 2013-11-14 (×7): qty 2

## 2013-11-14 MED ORDER — NALOXONE HCL 0.4 MG/ML IJ SOLN: 0.4000 mg | INTRAMUSCULAR | Status: DC | PRN

## 2013-11-14 MED ORDER — KETOROLAC TROMETHAMINE 30 MG/ML IJ SOLN
INTRAMUSCULAR | Status: AC
  Filled 2013-11-14: qty 1

## 2013-11-14 MED ORDER — NALOXONE HCL 1 MG/ML IJ SOLN
1.0000 ug/kg/h | INTRAVENOUS | Status: DC | PRN
  Filled 2013-11-14: qty 2

## 2013-11-14 MED ORDER — MENTHOL 3 MG MT LOZG: 1.0000 | LOZENGE | OROMUCOSAL | Status: DC | PRN

## 2013-11-14 MED ORDER — DIPHENHYDRAMINE HCL 25 MG PO CAPS
25.0000 mg | ORAL_CAPSULE | ORAL | Status: DC | PRN
  Administered 2013-11-15 – 2013-11-16 (×2): 25 mg via ORAL
  Filled 2013-11-14 (×2): qty 1

## 2013-11-14 MED ORDER — LANOLIN HYDROUS EX OINT: 1.0000 "application " | TOPICAL_OINTMENT | CUTANEOUS | Status: DC | PRN

## 2013-11-14 MED ORDER — TETANUS-DIPHTH-ACELL PERTUSSIS 5-2.5-18.5 LF-MCG/0.5 IM SUSP
0.5000 mL | Freq: Once | INTRAMUSCULAR | Status: AC
  Administered 2013-11-16: 0.5 mL via INTRAMUSCULAR
  Filled 2013-11-14: qty 0.5

## 2013-11-14 MED ORDER — PROMETHAZINE HCL 25 MG/ML IJ SOLN: 6.2500 mg | INTRAMUSCULAR | Status: DC | PRN

## 2013-11-14 MED ORDER — DIPHENHYDRAMINE HCL 25 MG PO CAPS
25.0000 mg | ORAL_CAPSULE | Freq: Four times a day (QID) | ORAL | Status: DC | PRN
  Administered 2013-11-15: 25 mg via ORAL
  Filled 2013-11-14: qty 1

## 2013-11-14 MED ORDER — SCOPOLAMINE 1 MG/3DAYS TD PT72
1.0000 | MEDICATED_PATCH | Freq: Once | TRANSDERMAL | Status: DC
  Administered 2013-11-14: 1.5 mg via TRANSDERMAL

## 2013-11-14 MED ORDER — ZOLPIDEM TARTRATE 5 MG PO TABS: 5.0000 mg | ORAL_TABLET | Freq: Every evening | ORAL | Status: DC | PRN

## 2013-11-14 MED ORDER — NALBUPHINE HCL 10 MG/ML IJ SOLN
5.0000 mg | INTRAMUSCULAR | Status: DC | PRN
  Administered 2013-11-14 (×2): 10 mg via INTRAVENOUS
  Filled 2013-11-14 (×2): qty 1

## 2013-11-14 MED ORDER — SENNOSIDES-DOCUSATE SODIUM 8.6-50 MG PO TABS
2.0000 | ORAL_TABLET | ORAL | Status: DC
  Administered 2013-11-14 – 2013-11-16 (×2): 2 via ORAL
  Filled 2013-11-14 (×2): qty 2

## 2013-11-14 MED ORDER — DIPHENHYDRAMINE HCL 50 MG/ML IJ SOLN: 25.0000 mg | INTRAMUSCULAR | Status: DC | PRN

## 2013-11-14 MED ORDER — KETOROLAC TROMETHAMINE 30 MG/ML IJ SOLN: 30.0000 mg | Freq: Four times a day (QID) | INTRAMUSCULAR | Status: AC | PRN

## 2013-11-14 MED ORDER — SCOPOLAMINE 1 MG/3DAYS TD PT72
MEDICATED_PATCH | TRANSDERMAL | Status: AC
  Filled 2013-11-14: qty 1

## 2013-11-14 MED ORDER — PRENATAL MULTIVITAMIN CH
1.0000 | ORAL_TABLET | Freq: Every day | ORAL | Status: DC
  Administered 2013-11-14 – 2013-11-16 (×3): 1 via ORAL
  Filled 2013-11-14 (×3): qty 1

## 2013-11-14 MED ORDER — SIMETHICONE 80 MG PO CHEW
80.0000 mg | CHEWABLE_TABLET | ORAL | Status: DC | PRN
  Filled 2013-11-14: qty 1

## 2013-11-14 MED ORDER — METOCLOPRAMIDE HCL 5 MG/ML IJ SOLN: 10.0000 mg | Freq: Three times a day (TID) | INTRAMUSCULAR | Status: DC | PRN

## 2013-11-14 MED ORDER — SIMETHICONE 80 MG PO CHEW
80.0000 mg | CHEWABLE_TABLET | ORAL | Status: DC
  Administered 2013-11-14 – 2013-11-16 (×2): 80 mg via ORAL
  Filled 2013-11-14 (×2): qty 1

## 2013-11-14 MED ORDER — ONDANSETRON HCL 4 MG PO TABS
4.0000 mg | ORAL_TABLET | ORAL | Status: DC | PRN
  Administered 2013-11-15: 4 mg via ORAL
  Filled 2013-11-14 (×2): qty 1

## 2013-11-14 MED ORDER — FENTANYL CITRATE 0.05 MG/ML IJ SOLN
25.0000 ug | INTRAMUSCULAR | Status: DC | PRN
  Administered 2013-11-14: 50 ug via INTRAVENOUS

## 2013-11-14 MED ORDER — MEPERIDINE HCL 25 MG/ML IJ SOLN
6.2500 mg | INTRAMUSCULAR | Status: DC | PRN
  Administered 2013-11-14: 6.25 mg via INTRAVENOUS

## 2013-11-14 MED ORDER — MEPERIDINE HCL 25 MG/ML IJ SOLN
INTRAMUSCULAR | Status: AC
  Filled 2013-11-14: qty 1

## 2013-11-14 MED ORDER — WITCH HAZEL-GLYCERIN EX PADS: 1.0000 "application " | MEDICATED_PAD | CUTANEOUS | Status: DC | PRN

## 2013-11-14 NOTE — Op Note (Signed)
[name]:  Robin Gutierrez, Robin Gutierrez              ACCOUNT NO.:  [id number]  MEDICAL RECORD NO.:  [id number]  LOCATION:  9125                          FACILITY:  [facility]  PHYSICIAN:  Philip AspenSidney Ashtyn Freilich, DO    DATE OF BIRTH:  Jul 05, 1981  DATE OF PROCEDURE:  11/14/2013 DATE OF DISCHARGE:                              OPERATIVE REPORT   PREOPERATIVE DIAGNOSIS:  Gestational hypertension, breech presentation.  POSTOPERATIVE DIAGNOSIS:  Gestational hypertension, breech presentation.  PROCEDURE:  Low-transverse cesarean section.  SURGEON:  Philip AspenSidney Oliwia Berzins, DO.  ANESTHESIA:  Spinal.  ESTIMATED BLOOD LOSS:  800 mL.  URINE OUTPUT:  300 mL.  IV FLUIDS:  1100 mL.  SPECIMENS:  Cord blood.  FINDINGS:  Female infant in frank breech presentation with clear fluid. Normal ovaries and tubes bilaterally.  Apgars and weight pending.  COMPLICATIONS:  None.  CONDITION:  Stable to PACU.  DESCRIPTION OF PROCEDURE:  The patient was taken to the operating room, where spinal anesthesia was administered and found to be adequate.  She was prepped and draped in the normal sterile fashion in dorsal supine position.  A Pfannenstiel skin incision was performed with scalpel and carried down to the underlying layer of fascia with Bovie cautery. Fascia was incised with a scalpel in the midline and extended laterally with Mayo scissors.  Kocher clamps were placed at the superior aspect of the fascial incision.  Rectus muscles were dissected off bluntly and sharply.  Kocher clamps were then placed at the inferior aspect of the fascia, tented, and the rectus muscles were again dissected off bluntly and sharply.  Rectus muscles were separated at the midline with the use of hemostats.  The peritoneum was identified, tented up, and entered sharply.  The peritoneal incision was extended laterally by traction. The uterus was palpated in the upper abdomen and no abnormalities were found.  Alexis self retractor was placed and a bladder  flap was created by tenting of the vesicouterine peritoneum and Metzenbaum scissors were developed bluntly.  A low transverse cesarean incision was performed. Amniotic sac was entered sharply.  As it ballooned from the incision, the incision was extended by cephalic and caudal traction.  The infant's buttocks were identified and brought through the incision.  Bandage scissors were used to further enlarge the incision for about 2 cm.  The buttocks were then elevated, legs delivered, shoulders then delivered by sweeping anteriorly and rolling them from side-to-side.  It was kept in flexed position and shunt was delivered without difficulty.  Cord was clamped and cut and infant was handed off to awaiting neonatology.  The placenta was delivered by gentle traction on the umbilical cord and external uterine massage.  All clots and debris were removed with a sponge.  The uterine incision was then reapproximated and closed with Vicryl in a running fashion.  A second layer of horizontal Lembert imbrication was performed and hemostasis was assured.  The Alexis self retractor was removed.  The incision again observed and found to be hemostatic with minimal use of Bovie cautery along the serosal incision superiorly.  The peritoneum was identified and closed with Monocryl in a running fashion.  The fascia was then reapproximated and closed with Vicryl in  a running fashion.  Subcutaneous tissue was irrigated, dried, and minimal Bovie cautery was needed for hemostasis.  The skin was then reapproximated and closed with 4-0 Monocryl subcuticularly.  Steri- Strips were placed.  Sponge, lap, and needle counts were correct x2. The patient was stable and taken to recovery in stable condition.          ______________________________ Philip AspenSidney Robin Fluhr, DO     [place]/MEDQ  D:  11/14/2013  T:  11/14/2013  Job:  [id number]

## 2013-11-14 NOTE — Transfer of Care (Signed)
Immediate Anesthesia Transfer of Care Note  Patient: Robin Gutierrez  Procedure(s) Performed: Procedure(s): CESAREAN SECTION (N/A)  Patient Location: PACU  Anesthesia Type:Spinal  Level of Consciousness: awake, alert  and oriented  Airway & Oxygen Therapy: Patient Spontanous Breathing  Post-op Assessment: Report given to PACU RN and Post -op Vital signs reviewed and stable  Post vital signs: Reviewed and stable  Complications: No apparent anesthesia complications

## 2013-11-14 NOTE — Progress Notes (Signed)
POD#1 Pt and baby are doing well. VSSAF IMP/ stable Plan/ routine care.

## 2013-11-14 NOTE — Brief Op Note (Signed)
11/13/2013 - 11/14/2013  12:40 AM  PATIENT:  Robin Gutierrez  33 y.o. female  PRE-OPERATIVE DIAGNOSIS:  Gestational HTN, breech presentation  POST-OPERATIVE DIAGNOSIS: same  PROCEDURE:  Procedure(s): CESAREAN SECTION (N/A)  SURGEON:  Surgeon(s) and Role:    Philip Aspen* Wynton Hufstetler, DO - Primary  ANESTHESIA:   spinal  EBL:  Total I/O In: 1100 [I.V.:1100] Out: 1100 [Urine:300; Blood:800]  SPECIMEN:  Source of Specimen:  cord blood  DISPOSITION OF SPECIMEN:  N/A  COUNTS:  YES  PLAN OF CARE: Admit to inpatient   PATIENT DISPOSITION:  PACU - hemodynamically stable.   Delay start of Pharmacological VTE agent (>24hrs) due to surgical blood loss or risk of bleeding: no

## 2013-11-14 NOTE — Anesthesia Postprocedure Evaluation (Signed)
  Anesthesia Post-op Note  Patient: Robin Gutierrez  Procedure(s) Performed: Procedure(s): CESAREAN SECTION (N/A)  Patient Location: PACU  Anesthesia Type:Spinal  Level of Consciousness: awake, alert  and oriented  Airway and Oxygen Therapy: Patient Spontanous Breathing  Post-op Pain: none  Post-op Assessment: Post-op Vital signs reviewed, Patient's Cardiovascular Status Stable, Respiratory Function Stable, Patent Airway, No signs of Nausea or vomiting and Pain level controlled  Post-op Vital Signs: Reviewed and stable  Last Vitals:  Filed Vitals:   11/14/13 0130  BP: 126/71  Pulse: 87  Temp: 36.3 C  Resp: 22    Complications: No apparent anesthesia complications

## 2013-11-14 NOTE — Progress Notes (Signed)
UR chart review completed.  

## 2013-11-14 NOTE — Anesthesia Postprocedure Evaluation (Signed)
Anesthesia Post Note  Patient: Robin Gutierrez  Procedure(s) Performed: Procedure(s) (LRB): CESAREAN SECTION (N/A)  Anesthesia type: Spinal  Patient location: Mother/Baby  Post pain: Pain level controlled  Post assessment: Post-op Vital signs reviewed  Last Vitals:  Filed Vitals:   11/14/13 0730  BP: 114/68  Pulse: 80  Temp: 36.7 C  Resp: 18    Post vital signs: Reviewed  Level of consciousness: awake  Complications: No apparent anesthesia complications

## 2013-11-14 NOTE — Lactation Note (Signed)
This note was copied from the chart of Robin Gutierrez. Lactation Consultation Note Assisted mother in placing baby in fb hold.  Reviewed hand expression.  Using breast compression, baby sucked a few times but was unable to sustain latch.  Baby sleepy. Taught FOB how to compress the breast to assist in latching the baby. Encouraged mother to place baby STS and call for assistance with latching if needed.   Patient Name: Robin Gutierrez QMVHQ'IToday's Date: 11/14/2013 Reason for consult: Follow-up assessment   Maternal Data    Feeding Feeding Type: Breast Fed Length of feed: 9 min  LATCH Score/Interventions Latch: Repeated attempts needed to sustain latch, nipple held in mouth throughout feeding, stimulation needed to elicit sucking reflex. Intervention(s): Adjust position;Assist with latch;Breast massage;Breast compression  Audible Swallowing: None Intervention(s): Skin to skin;Hand expression Intervention(s): Alternate breast massage  Type of Nipple: Everted at rest and after stimulation  Comfort (Breast/Nipple): Soft / non-tender     Hold (Positioning): Assistance needed to correctly position infant at breast and maintain latch. Intervention(s): Breastfeeding basics reviewed;Support Pillows;Position options;Skin to skin  LATCH Score: 6  Lactation Tools Discussed/Used     Consult Status Consult Status: Follow-up Date: 11/15/13 Follow-up type: In-patient    Dulce SellarRuth Boschen Evanie Buckle 11/14/2013, 5:30 PM

## 2013-11-14 NOTE — Addendum Note (Signed)
Addendum created 11/14/13 0939 by Graciela HusbandsWynn O Dhairya Corales, CRNA   Modules edited: Notes Section   Notes Section:  File: [id number]

## 2013-11-15 ENCOUNTER — Encounter (HOSPITAL_COMMUNITY): Payer: Self-pay | Admitting: Obstetrics and Gynecology

## 2013-11-15 NOTE — Lactation Note (Signed)
This note was copied from the chart of Robin Gutierrez. Lactation Consultation Note  Rn reports mom requested bottle as preference to syringe for formula supplementations.  RN reports mom has great colostrum with audible swallows and parents still wanted to supplement.  Briefly spoke with parents who deny any concerns and decline assistance at this time.  Encouraged 10-12 feedings every 24 hours and to call for assist as needed.    Patient Name: Robin Piedad ClimesStormie Violette ZOXWR'UToday's Date: 11/15/2013     Maternal Data    Feeding Feeding Type: Formula Length of feed: 20 min  LATCH Score/Interventions Latch: Repeated attempts needed to sustain latch, nipple held in mouth throughout feeding, stimulation needed to elicit sucking reflex.  Audible Swallowing: Spontaneous and intermittent Intervention(s): Skin to skin  Type of Nipple: Everted at rest and after stimulation  Comfort (Breast/Nipple): Soft / non-tender     Hold (Positioning): No assistance needed to correctly position infant at breast.  LATCH Score: 9  Lactation Tools Discussed/Used     Consult Status      Arvella MerlesJana Lynn [facility] 11/15/2013, 9:11 PM

## 2013-11-15 NOTE — Progress Notes (Addendum)
Subjective: Postpartum Day 1-2 s/p Primary Cesarean Delivery for Breech Patient reports incisional pain, well controlled. She is having some pruritus.  No nausea, vomiting, chest pain or shortness of breath  Objective: Vital signs in last 24 hours: Temp:  [96.8 F (36 C)-98.1 F (36.7 C)] 96.8 F (36 C) (05/13 0543) Pulse Rate:  [80-83] 80 (05/13 0543) Resp:  [18-20] 18 (05/13 0543) BP: (110-120)/(71-78) 111/72 mmHg (05/13 0543) SpO2:  [98 %-99 %] 99 % (05/12 2330)  Physical Exam:  General: alert, cooperative and no distress Lochia: appropriate Uterine Fundus: firm Incision: healing well, no significant drainage, no significant erythema DVT Evaluation: No evidence of DVT seen on physical exam. Negative Homan's sign. No cords or calf tenderness.   Recent Labs  11/13/13 1632 11/14/13 0620  HGB 11.7* 9.8*  HCT 34.1* 28.8*    Assessment/Plan: Status post Cesarean section. Doing well postoperatively.  Blood pressures have been normal Continue current care. Benadryl prn pruritus  Gali Spinney 11/15/2013, 11:43 AM

## 2013-11-16 ENCOUNTER — Other Ambulatory Visit (HOSPITAL_COMMUNITY): Payer: Medicaid Other

## 2013-11-16 MED ORDER — DOCUSATE SODIUM 100 MG PO CAPS: 100.0000 mg | ORAL_CAPSULE | Freq: Two times a day (BID) | ORAL | Status: AC

## 2013-11-16 MED ORDER — IBUPROFEN 600 MG PO TABS: 600.0000 mg | ORAL_TABLET | Freq: Four times a day (QID) | ORAL | Status: AC | PRN

## 2013-11-16 MED ORDER — OXYCODONE-ACETAMINOPHEN 5-325 MG PO TABS: 2.0000 | ORAL_TABLET | ORAL | Status: AC | PRN

## 2013-11-16 NOTE — Discharge Summary (Signed)
Obstetric Discharge Summary Reason for Admission: cesarean section Prenatal Procedures: NST and ultrasound Intrapartum Procedures: cesarean: low cervical, transverse Postpartum Procedures: none Complications-Operative and Postpartum: none Hemoglobin  Date Value Ref Range Status  11/14/2013 9.8* 12.0 - 15.0 g/dL Final     HCT  Date Value Ref Range Status  11/14/2013 28.8* 36.0 - 46.0 % Final    Physical Exam:  General: alert, cooperative and appears stated age 59Lochia: appropriate Uterine Fundus: firm Incision: healing well DVT Evaluation: No evidence of DVT seen on physical exam.  Discharge Diagnoses: Term Pregnancy-delivered, gestational hypertension  Discharge Information: Date: 11/16/2013 Activity: pelvic rest Diet: routine Medications: Ibuprofen, Colace and Percocet Condition: improved Instructions: refer to practice specific booklet Discharge to: home Follow-up Information   Follow up with CALLAHAN, SIDNEY, DO In 4 weeks. (For a postpartum evaluation)    Specialty:  Obstetrics and Gynecology   Contact information:   [address] [place] [id number] [phone]       Newborn Data: Live born female  Birth Weight: 8 lb 5.2 oz (3776 g) APGAR: 9, 9  Home with mother.  Robin MarchKendra H. Derionna Gutierrez 11/16/2013, 9:38 AM

## 2013-11-16 NOTE — Lactation Note (Signed)
This note was copied from the chart of Robin Gutierrez. Lactation Consultation Note: Follow up visit with mom before DC. She reports that baby has been nursing well. Reports a little pain with latch but then eases off. Asking about pump. Has Medela PIS from a friend.Asking about suction- reviewed set up and cleaning. Tried and reports that feels fine. Reviewed [facility] and OP appointments as resources for support after DC. No further questions at present. To call prn  Patient Name: Robin Piedad ClimesStormie Baena UYQIH'KToday's Date: 11/16/2013 Reason for consult: Follow-up assessment   Maternal Data Formula Feeding for Exclusion: No Has patient been taught Hand Expression?: Yes Does the patient have breastfeeding experience prior to this delivery?: No  Feeding Feeding Type: Formula Length of feed: 10 min  LATCH Score/Interventions       Type of Nipple: Everted at rest and after stimulation  Comfort (Breast/Nipple): Soft / non-tender           Lactation Tools Discussed/Used     Consult Status Consult Status: Complete    Pamelia HoitDonna D Dymond Gutt 11/16/2013, 10:00 AM

## 2013-11-17 ENCOUNTER — Inpatient Hospital Stay (HOSPITAL_COMMUNITY)
Admission: RE | Admit: 2013-11-17 | Payer: Medicaid Other | Source: Ambulatory Visit | Admitting: Obstetrics & Gynecology

## 2013-11-17 ENCOUNTER — Encounter (HOSPITAL_COMMUNITY): Admission: RE | Payer: Self-pay | Source: Ambulatory Visit

## 2013-11-17 SURGERY — Surgical Case
Anesthesia: Regional

## 2013-12-04 ENCOUNTER — Ambulatory Visit (HOSPITAL_COMMUNITY): Admission: RE | Admit: 2013-12-04 | Payer: Medicaid Other | Source: Ambulatory Visit

## 2014-05-07 ENCOUNTER — Encounter (HOSPITAL_COMMUNITY): Payer: Self-pay | Admitting: Obstetrics and Gynecology

## 2016-02-17 ENCOUNTER — Other Ambulatory Visit: Payer: Self-pay | Admitting: Obstetrics & Gynecology

## 2016-02-18 LAB — CYTOLOGY - PAP
